# Patient Record
Sex: Female | Born: 1995 | Race: Black or African American | Hispanic: No | Marital: Single | Smoking: Current every day smoker
Health system: Southern US, Community
[De-identification: ages and names within clinical notes are randomized; demographics above are authoritative.]

---
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
## 2016-04-05 ENCOUNTER — Emergency Department (HOSPITAL_COMMUNITY): Admission: EM | Admit: 2016-04-05 | Discharge: 2016-04-06 | Discharge status: Against Medical Advice | Payer: Self-pay

## 2016-05-19 ENCOUNTER — Ambulatory Visit (HOSPITAL_COMMUNITY)
Admission: EM | Admit: 2016-05-19 | Discharge: 2016-05-19 | Disposition: A | Discharge status: Home / Self Care | Payer: Self-pay | Attending: Family Medicine | Admitting: Family Medicine

## 2016-05-19 ENCOUNTER — Encounter (HOSPITAL_COMMUNITY): Payer: Self-pay | Admitting: Emergency Medicine

## 2016-05-19 DIAGNOSIS — J111 Influenza due to unidentified influenza virus with other respiratory manifestations: Secondary | ICD-10-CM

## 2016-05-19 DIAGNOSIS — R69 Illness, unspecified: Secondary | ICD-10-CM

## 2016-05-19 MED ORDER — OSELTAMIVIR PHOSPHATE 75 MG PO CAPS: 75.0000 mg | ORAL_CAPSULE | Freq: Two times a day (BID) | ORAL | 0 refills | Status: AC

## 2016-05-19 NOTE — Discharge Instructions (Signed)
Continue to push fluids, practice good hand hygiene, and cover your mouth if you cough.  If you start having fevers, shaking or shortness of breath, seek immediate care.  This typically lasts 5-7 days. The medicine can decrease duration of illness. Ibuprofen and Tylenol for aches and pains.

## 2016-05-19 NOTE — ED Provider Notes (Signed)
  [facility]    CSN: [id number] Arrival date & time: 05/19/16  1205     History   Chief Complaint Chief Complaint  Patient presents with  . Cough    HPI Kara Cook is a 21 y.o. female.   HPI  Duration: 1 day  Associated symptoms: subjective fever, sinus congestion, dry cough and myalgia Denies: rhinorrhea, itchy watery eyes, ear pain, ear drainage, sore throat, shortness of breath and shaking Treatment to date: None Sick contacts: Yes- father with flu  History reviewed. No pertinent past medical history.   History reviewed. No pertinent surgical history.  Home Medications    Takes no meds routinely.   Family History History reviewed. No pertinent family history.  Social History Social History  Substance Use Topics  . Smoking status: Current Every Day Smoker  . Smokeless tobacco: Current User  . Alcohol use Yes     Comment: social      Allergies   Penicillins   Review of Systems Review of Systems  HENT: Positive for congestion.   Respiratory: Positive for cough. Negative for shortness of breath.      Physical Exam BP 135/87 (BP Location: Left Arm)   Pulse 95   Temp 99.2 F (37.3 C) (Oral)   Resp 18   LMP 04/26/2016   SpO2 98%   Updated Vital Signs BP 135/87 (BP Location: Left Arm)   Pulse 95   Temp 99.2 F (37.3 C) (Oral)   Resp 18   LMP 04/26/2016   SpO2 98%   Physical Exam  Constitutional: She appears well-developed and well-nourished.  HENT:  Head: Normocephalic.  Right Ear: External ear normal.  Left Ear: External ear normal.  Nose: Nose normal.  Mouth/Throat: Oropharynx is clear and moist. No oropharyngeal exudate.  Eyes: EOM are normal. Pupils are equal, round, and reactive to light.  Neck: Normal range of motion. Neck supple.  Cardiovascular: Normal rate and regular rhythm.   No murmur heard. Pulmonary/Chest: Effort normal and breath sounds normal. No respiratory distress.  Skin: Skin is warm and dry.  She is not diaphoretic.  Psychiatric: She has a normal mood and affect. Judgment normal.    UC Treatments / Results  Procedures Procedures - none  Initial Impression / Assessment and Plan / UC Course  I have reviewed the triage vital signs and the nursing notes.  Pertinent labs & imaging results that were available during my care of the patient were reviewed by me and considered in my medical decision making (see chart for details).     [age] yo female presents with flu like symptoms and a close personal sick contact being treated for flu. She did present within the 2 day window of where Tamiflu will be helpful. Continue to push fluids, practice good hand hygiene, and use ibuprofen/acetaminophen for pain/fevers. Follow up with PCP if symptoms fail to improve. Letter for work given excusing patient through Tuesday, 3/13. The patient voiced understanding and agreement to the plan.   Final Clinical Impressions(s) / UC Diagnoses   Final diagnoses:  Influenza-like illness    New Prescriptions Discharge Medication List as of 05/19/2016 12:58 PM    START taking these medications   Details  oseltamivir (TAMIFLU) 75 MG capsule Take 1 capsule (75 mg total) by mouth 2 (two) times daily., Starting Sun 05/19/2016, Normal         Jilda Rocheicholas Paul [place], [place] 05/19/16 1648

## 2016-05-19 NOTE — ED Triage Notes (Signed)
[facility] a report of body aches, cough and congestion since yesterday. Patient states that she felt like she was running a fever yesterday.

## 2018-02-23 ENCOUNTER — Encounter (HOSPITAL_COMMUNITY): Payer: Self-pay | Admitting: Emergency Medicine

## 2018-02-23 ENCOUNTER — Ambulatory Visit (HOSPITAL_COMMUNITY)
Admission: EM | Admit: 2018-02-23 | Discharge: 2018-02-23 | Disposition: A | Discharge status: Home / Self Care | Payer: Self-pay | Attending: Family Medicine | Admitting: Family Medicine

## 2018-02-23 DIAGNOSIS — J209 Acute bronchitis, unspecified: Secondary | ICD-10-CM | POA: Insufficient documentation

## 2018-02-23 MED ORDER — AZITHROMYCIN 250 MG PO TABS
500.0000 mg | ORAL_TABLET | Freq: Once | ORAL | Status: AC
  Administered 2018-02-23: 500 mg via ORAL

## 2018-02-23 MED ORDER — AZITHROMYCIN 250 MG PO TABS: ORAL_TABLET | ORAL | 0 refills | Status: AC

## 2018-02-23 MED ORDER — AZITHROMYCIN 250 MG PO TABS
ORAL_TABLET | ORAL | Status: AC
  Filled 2018-02-23: qty 2

## 2018-02-23 NOTE — ED Triage Notes (Signed)
Pt c/o chest congestion and cough, with headache. x4 days for cough. Headache started today.

## 2018-02-23 NOTE — ED Provider Notes (Signed)
[facility]    CSN: [id number] Arrival date & time: 02/23/18  1933     History   Chief Complaint Chief Complaint  Patient presents with  . Cough    HPI Kara Cook is a 22 y.o. female.   Several day history of productive cough with wheezing low-grade fever denies any sore throat earache or chest pain.  No history of asthma or other lung disease  HPI  History reviewed. No pertinent past medical history.  There are no active problems to display for this patient.   History reviewed. No pertinent surgical history.  OB History   No obstetric history on file.      Home Medications    Prior to Admission medications   Medication Sig Start Date End Date Taking? Authorizing Provider  oseltamivir (TAMIFLU) 75 MG capsule Take 1 capsule (75 mg total) by mouth 2 (two) times daily. Patient not taking: Reported on 02/23/2018 05/19/16   Sharlene DoryWendling, Nicholas Paul, DO    Family History No family history on file.  Social History Social History   Tobacco Use  . Smoking status: Current Every Day Smoker  . Smokeless tobacco: Current User  Substance Use Topics  . Alcohol use: Yes    Comment: social   . Drug use: Yes    Types: Marijuana     Allergies   Penicillins   Review of Systems Review of Systems  Constitutional: Negative.   HENT: Positive for congestion and sinus pain.   Respiratory: Positive for cough and wheezing.   Cardiovascular: Negative.   Gastrointestinal: Negative.   Neurological: Negative.   Psychiatric/Behavioral: Negative.      Physical Exam Triage Vital Signs ED Triage Vitals [02/23/18 2017]  Enc Vitals Group     BP (!) 133/92     Pulse Rate 79     Resp 16     Temp 98.8 F (37.1 C)     Temp Source Oral     SpO2 100 %     Weight      Height      Head Circumference      Peak Flow      Pain Score 3     Pain Loc      Pain Edu?      Excl. in GC?    No data found.  Updated Vital Signs BP (!) 133/92 (BP Location: Left  Arm)   Pulse 79   Temp 98.8 F (37.1 C) (Oral)   Resp 16   LMP 02/14/2018   SpO2 100%   Visual Acuity Right Eye Distance:   Left Eye Distance:   Bilateral Distance:    Right Eye Near:   Left Eye Near:    Bilateral Near:     Physical Exam Constitutional:      Appearance: Normal appearance.  HENT:     Head: Normocephalic.     Right Ear: Tympanic membrane normal.     Left Ear: Tympanic membrane normal.     Nose: Nose normal.     Mouth/Throat:     Mouth: Mucous membranes are moist.     Pharynx: Oropharynx is clear.  Cardiovascular:     Rate and Rhythm: Normal rate and regular rhythm.  Pulmonary:     Effort: Pulmonary effort is normal.     Breath sounds: Wheezing and rhonchi present.  Neurological:     General: No focal deficit present.     Mental Status: She is alert and oriented to person, place, and  time.      UC Treatments / Results  Labs (all labs ordered are listed, but only abnormal results are displayed) Labs Reviewed - No data to display  EKG None  Radiology No results found.  Procedures Procedures (including critical care time)  Medications Ordered in UC Medications  azithromycin (ZITHROMAX) tablet 500 mg (has no administration in time range)    Initial Impression / Assessment and Plan / UC Course  I have reviewed the triage vital signs and the nursing notes.  Pertinent labs & imaging results that were available during my care of the patient were reviewed by me and considered in my medical decision making (see chart for details).     Bronchitis.  Patient has productive cough with wheezes and colored sputum Final Clinical Impressions(s) / UC Diagnoses   Final diagnoses:  None   Discharge Instructions   None    ED Prescriptions    None     Controlled Substance Prescriptions [place] Controlled Substance Registry consulted? No   Frederica Kuster, MD 02/23/18 2038
